# Patient Record
Sex: Male | Born: 1960
Health system: Southern US, Community
[De-identification: ages and names within clinical notes are randomized; demographics above are authoritative.]

## PROBLEM LIST (undated history)

## (undated) DIAGNOSIS — E785 Hyperlipidemia, unspecified: Secondary | ICD-10-CM

## (undated) DIAGNOSIS — S92309A Fracture of unspecified metatarsal bone(s), unspecified foot, initial encounter for closed fracture: Secondary | ICD-10-CM

## (undated) DIAGNOSIS — L719 Rosacea, unspecified: Secondary | ICD-10-CM

## (undated) HISTORY — DX: Rosacea, unspecified: L71.9

## (undated) HISTORY — DX: Fracture of unspecified metatarsal bone(s), unspecified foot, initial encounter for closed fracture: S92.309A

## (undated) HISTORY — DX: Hyperlipidemia, unspecified: E78.5

## (undated) HISTORY — PX: OTHER SURGICAL HISTORY: SHX169

---

## 1998-04-16 DIAGNOSIS — S92309A Fracture of unspecified metatarsal bone(s), unspecified foot, initial encounter for closed fracture: Secondary | ICD-10-CM

## 1998-04-16 HISTORY — DX: Fracture of unspecified metatarsal bone(s), unspecified foot, initial encounter for closed fracture: S92.309A

## 2016-06-14 DIAGNOSIS — Z0000 Encounter for general adult medical examination without abnormal findings: Secondary | ICD-10-CM | POA: Diagnosis not present

## 2016-06-14 DIAGNOSIS — E7800 Pure hypercholesterolemia, unspecified: Secondary | ICD-10-CM | POA: Diagnosis not present

## 2016-06-14 DIAGNOSIS — Z125 Encounter for screening for malignant neoplasm of prostate: Secondary | ICD-10-CM | POA: Diagnosis not present

## 2016-10-12 DIAGNOSIS — T753XXA Motion sickness, initial encounter: Secondary | ICD-10-CM | POA: Diagnosis not present

## 2017-07-02 DIAGNOSIS — E7800 Pure hypercholesterolemia, unspecified: Secondary | ICD-10-CM | POA: Diagnosis not present

## 2017-07-02 DIAGNOSIS — Z1159 Encounter for screening for other viral diseases: Secondary | ICD-10-CM | POA: Diagnosis not present

## 2017-07-02 DIAGNOSIS — Z125 Encounter for screening for malignant neoplasm of prostate: Secondary | ICD-10-CM | POA: Diagnosis not present

## 2017-07-02 DIAGNOSIS — Z1211 Encounter for screening for malignant neoplasm of colon: Secondary | ICD-10-CM | POA: Diagnosis not present

## 2017-07-02 DIAGNOSIS — Z0000 Encounter for general adult medical examination without abnormal findings: Secondary | ICD-10-CM | POA: Diagnosis not present

## 2018-07-04 DIAGNOSIS — K219 Gastro-esophageal reflux disease without esophagitis: Secondary | ICD-10-CM | POA: Diagnosis not present

## 2018-07-04 DIAGNOSIS — E7800 Pure hypercholesterolemia, unspecified: Secondary | ICD-10-CM | POA: Diagnosis not present

## 2018-07-04 DIAGNOSIS — Z1211 Encounter for screening for malignant neoplasm of colon: Secondary | ICD-10-CM | POA: Diagnosis not present

## 2018-07-04 DIAGNOSIS — Z125 Encounter for screening for malignant neoplasm of prostate: Secondary | ICD-10-CM | POA: Diagnosis not present

## 2018-07-04 DIAGNOSIS — Z0000 Encounter for general adult medical examination without abnormal findings: Secondary | ICD-10-CM | POA: Diagnosis not present

## 2018-08-08 ENCOUNTER — Telehealth: Payer: Self-pay | Admitting: *Deleted

## 2018-08-08 NOTE — Telephone Encounter (Signed)
Spoke to patient appointment moved to 4/29 with dr harding -- in office visit  Patient verbalized understanding.

## 2018-08-11 ENCOUNTER — Ambulatory Visit: Payer: Self-pay | Admitting: Cardiology

## 2018-08-12 NOTE — Progress Notes (Signed)
PCP: Ryan Haynes, Ryan Enriquez, MD  Clinic Note: Chief Complaint  Patient presents with  . New Patient (Initial Visit)    Family history of CAD.  Baseline cardiac risk    HPI: Ryan Haynes is a 58 y.o. male who is being seen today for the evaluation of baseline cardiovascular risk at the request of Ryan Haynes, Ryan Bowlby, MD.  Ryan Haynes has a history of hyperlipidemia and borderline blood pressures as well as a family history with mother having an MI in her mid 6170s early 5670s followed by CABG (she is now 6186) also has maternal grandmother had an MI at 6165.  He was recently seen by Dr. Joelyn Haynes on July 04, 2018.  He at that time noted that he was feeling quite well with no active cardiac symptoms, however his wife had recently had an MI last year, and both of his siblings (sister and brother with high blood pressure and hyperlipidemia have been told that they should be evaluated for cardiac disease.  He therefore was concerned about baseline risk.  He is an otherwise really active gentleman who is a former Ryan Haynes.  Recent Hospitalizations: None  Studies Personally Reviewed - (if available, images/films reviewed: From Epic Chart or Care Everywhere)  None  Interval History: Ryan Haynes presents here today again stating that he is still very active.  Not able to do the 4 times a week gym exercising that he used to do, so he is trying to make do with what he can do at home exercising between a stationary bicycle and weights as well as doing yard work.  He usually would work out on the elliptical and other equipment at [employer] gym.  He enjoys lifting weights as well.  He denies any symptoms of chest tightness pressure with rest or exertion.  No dyspnea on exertion.  No PND orthopnea.  No edema.  No palpitations, lightheadedness, dizziness, weakness or syncope/near syncope. No TIA/amaurosis fugax symptoms. No melena, hematochezia, hematuria, or epstaxis. No claudication.  The patient does not have symptoms concerning for  COVID-19 infection (fever, chills, cough, or new shortness of breath).  The patient is practicing social distancing.  ROS: A comprehensive was performed. Review of Systems  Constitutional: Negative for malaise/fatigue.  HENT: Negative for congestion and nosebleeds.   Respiratory: Negative for wheezing.        Some issues with allergies  Cardiovascular:       Negative per HPI  Gastrointestinal: Negative for blood in stool and melena.  Genitourinary: Negative.   Musculoskeletal: Negative.   Neurological: Negative.  Negative for dizziness and headaches.  Psychiatric/Behavioral: Negative.   All other systems reviewed and are negative.  I have reviewed and (if needed) personally updated the patient's problem list, medications, allergies, past medical and surgical history, social and family history.   Past Medical History:  Diagnosis Date  . Hyperlipidemia    Not currently on medications.  Most recent LDL 110 March 2019  . Metatarsal fracture 2000   Left foot, third-fourth and fifth  . Rosacea     Past Surgical History:  Procedure Laterality Date  . None      Current Meds  Medication Sig  . pantoprazole (PROTONIX) 40 MG tablet Take 1 tablet by mouth daily.    Not on File  Social History   Tobacco Use  . Smoking status: Former Smoker    Packs/day: 0.50    Types: Cigarettes    Start date: 1978    Last attempt to quit: 1991  Years since quitting: 29.3  . Smokeless tobacco: Never Used  Substance Use Topics  . Alcohol use: Yes    Alcohol/week: 1.0 standard drinks    Types: 1 Cans of beer per week    Comment: Either beer or glass of rum  . Drug use: Never   Social History   Social History Narrative   Married father of 2, grandfather 2.   Usually works out 4 days a week for least 45 minutes to an hour.  Does weightlifting and elliptical trainer.  (Limited somewhat by COVID-19)    family history includes Coronary artery disease (age of onset: 1) in his mother;  Heart attack (age of onset: 45) in his maternal grandmother; Heart attack (age of onset: 71) in his mother; Hypertension in his brother and sister.  Wt Readings from Last 3 Encounters:  08/13/18 196 lb 12.8 oz (89.3 kg)    PHYSICAL EXAM BP 116/82 (BP Location: Right Arm)   Pulse 73   Ht 6' (1.829 m)   Wt 196 lb 12.8 oz (89.3 kg)   BMI 26.69 kg/m  Physical Exam  Constitutional: He is oriented to person, place, and time. He appears well-developed and well-nourished. No distress.  Healthy-appearing.  Well-groomed  HENT:  Head: Normocephalic and atraumatic.  Mouth/Throat: Oropharynx is clear and moist.  Eyes: Pupils are equal, round, and reactive to light. Conjunctivae and EOM are normal.  Neck: Neck supple. No hepatojugular reflux and no JVD present. Carotid bruit is not present.  Cardiovascular: Normal rate, regular rhythm, normal heart sounds and intact distal pulses.  No extrasystoles are present. PMI is not displaced. Exam reveals no gallop and no friction rub.  No murmur heard. Pulmonary/Chest: Effort normal and breath sounds normal. No respiratory distress. He has no wheezes. He has no rales.  Abdominal: Soft. Bowel sounds are normal. He exhibits no distension. There is no abdominal tenderness. There is no rebound.  Musculoskeletal: Normal range of motion.        General: No edema.  Neurological: He is alert and oriented to person, place, and time. No cranial nerve deficit.  Psychiatric: He has a normal mood and affect. His behavior is normal. Judgment and thought content normal.  Vitals reviewed.    Adult ECG Report  Rate: 73 ;  Rhythm: normal sinus rhythm, sinus arrhythmia and Otherwise normal axis, intervals and durations;   Narrative Interpretation: Normal EKG   Other studies Reviewed: Additional studies/ records that were reviewed today include:   Recent Labs: July 04, 2018  Na+ 141, K+ 4.5, Cl- 104, HCO3-31, BUN 22, Cr 1.1, Glu 88, Ca2+ 9.8; AST 19, ALT 24, AlkP 40   CBC: W 5.9, H/H 15.2/44.8, Plt 248; TSH 3.46.  PSA 0.42.  TC 159, TG 57, HDL 41, LDL 107  ASSESSMENT / PLAN:  Ryan Haynes is a relatively healthy gentleman with well-controlled blood pressure and mild hyperlipidemia.  He is concerned based on his family history.  He himself is asymptomatic and very active.  Relatively low risk.  We discussed potential screening options for coronary artery disease.  I discussed the pathophysiology of coronary artery disease and coronary calcification.  My recommendation would be to do a baseline evaluation with a coronary calcium score.  This will tell us the extent of any potential coronary artery disease and which will help guide management of his hyperlipidemia.  Do not think a stress test would be at all helpful.  Problem List Items Addressed This Visit    Endogenous hyperlipidemia (Chronic)  Relevant Orders   EKG 12-Lead (Completed)   CT CARDIAC SCORING   Family history of coronary artery disease - Primary (Chronic)   Relevant Orders   EKG 12-Lead (Completed)   CT CARDIAC SCORING     Depending on the results of his coronary calcium score, we will may or may not need to see him back in follow-up.  He does understand that with the COVID-19 restrictions, this procedure will likely be delayed until we are doing elective noninvasive procedures..  I spent a total of 25 minutes with the patient and chart review. >  50% of the time was spent in direct patient consultation.   COVID-19 Education: The signs and symptoms of COVID-19 were discussed with the patient and how to seek care for testing (follow up with PCP or arrange E-visit).   The importance of social distancing was discussed today.  Current medicines are reviewed at length with the patient today.  (+/- concerns) none The following changes have been made:  None  Patient Instructions  Medication Instructions:  NOT NEEDED If you need a refill on your cardiac medications before your next  appointment, please call your pharmacy.   Lab work: NO LABS  If you have labs (blood work) drawn today and your tests are completely normal, you will receive your results only by: Marland Kitchen MyChart Message (if you have MyChart) OR . A paper copy in the mail If you have any lab test that is abnormal or we need to change your treatment, we will call you to review the results.  Testing/Procedures:  CT coronary calcium score. This test is done at [address]. This is $150 out of pocket.  Follow-Up: At [facility], you and your health needs are our priority.  As part of our continuing mission to provide you with exceptional heart care, we have created designated Provider Care Teams.  These Care Teams include your primary Cardiologist (physician) and Advanced Practice Providers (APPs -  Physician Assistants and Nurse Practitioners) who all work together to provide you with the care you need, when you need it. . Your physician recommends that you schedule a follow-up appointment ON AN AS NEEDED BASIS UNLESS TEST COMES BACK ABNORMAL. Marland Kitchen   Any Other Special Instructions Will Be Listed Below (If Applicable).    Studies Ordered:   Orders Placed This Encounter  Procedures  . CT CARDIAC SCORING  . EKG 12-Lead      Bryan Lemma, M.D., M.S. Interventional Cardiologist   Pager # [phone] Phone # [phone] [address] [place], [place] [address]   Thank you for choosing Heartcare at [facility]!!

## 2018-08-13 ENCOUNTER — Ambulatory Visit (INDEPENDENT_AMBULATORY_CARE_PROVIDER_SITE_OTHER): Payer: BLUE CROSS/BLUE SHIELD | Admitting: Cardiology

## 2018-08-13 ENCOUNTER — Other Ambulatory Visit: Payer: Self-pay

## 2018-08-13 ENCOUNTER — Encounter: Payer: Self-pay | Admitting: Cardiology

## 2018-08-13 VITALS — BP 116/82 | HR 73 | Ht 72.0 in | Wt 196.8 lb

## 2018-08-13 DIAGNOSIS — E781 Pure hyperglyceridemia: Secondary | ICD-10-CM | POA: Diagnosis not present

## 2018-08-13 DIAGNOSIS — Z8249 Family history of ischemic heart disease and other diseases of the circulatory system: Secondary | ICD-10-CM | POA: Diagnosis not present

## 2018-08-13 NOTE — Patient Instructions (Signed)
Medication Instructions:  NOT NEEDED If you need a refill on your cardiac medications before your next appointment, please call your pharmacy.   Lab work: NO LABS  If you have labs (blood work) drawn today and your tests are completely normal, you will receive your results only by: Marland Kitchen MyChart Message (if you have MyChart) OR . A paper copy in the mail If you have any lab test that is abnormal or we need to change your treatment, we will call you to review the results.  Testing/Procedures:  CT coronary calcium score. This test is done at [address]. This is $150 out of pocket.   Coronary CalciumScan A coronary calcium scan is an imaging test used to look for deposits of calcium and other fatty materials (plaques) in the inner lining of the blood vessels of the heart (coronary arteries). These deposits of calcium and plaques can partly clog and narrow the coronary arteries without producing any symptoms or warning signs. This puts a person at risk for a heart attack. This test can detect these deposits before symptoms develop. Tell a health care provider about:  Any allergies you have.  All medicines you are taking, including vitamins, herbs, eye drops, creams, and over-the-counter medicines.  Any problems you or family members have had with anesthetic medicines.  Any blood disorders you have.  Any surgeries you have had.  Any medical conditions you have.  Whether you are pregnant or may be pregnant. What are the risks? Generally, this is a safe procedure. However, problems may occur, including:  Harm to a pregnant woman and her unborn baby. This test involves the use of radiation. Radiation exposure can be dangerous to a pregnant woman and her unborn baby. If you are pregnant, you generally should not have this procedure done.  Slight increase in the risk of cancer. This is because of the radiation involved in the test. What happens before the procedure? No  preparation is needed for this procedure. What happens during the procedure?  You will undress and remove any jewelry around your neck or chest.  You will put on a hospital gown.  Sticky electrodes will be placed on your chest. The electrodes will be connected to an electrocardiogram (ECG) machine to record a tracing of the electrical activity of your heart.  A CT scanner will take pictures of your heart. During this time, you will be asked to lie still and hold your breath for 2-3 seconds while a picture of your heart is being taken. The procedure may vary among health care providers and hospitals. What happens after the procedure?  You can get dressed.  You can return to your normal activities.  It is up to you to get the results of your test. Ask your health care provider, or the department that is doing the test, when your results will be ready. Summary  A coronary calcium scan is an imaging test used to look for deposits of calcium and other fatty materials (plaques) in the inner lining of the blood vessels of the heart (coronary arteries).  Generally, this is a safe procedure. Tell your health care provider if you are pregnant or may be pregnant.  No preparation is needed for this procedure.  A CT scanner will take pictures of your heart.  You can return to your normal activities after the scan is done. This information is not intended to replace advice given to you by your health care provider. Make sure you  discuss any questions you have with your health care provider. Document Released: 09/29/2007 Document Revised: 02/20/2016 Document Reviewed: 02/20/2016 Elsevier Interactive Patient Education  2017 [employer]   Follow-Up: At [facility], you and your health needs are our priority.  As part of our continuing mission to provide you with exceptional heart care, we have created designated Provider Care Teams.  These Care Teams include your primary Cardiologist  (physician) and Advanced Practice Providers (APPs -  Physician Assistants and Nurse Practitioners) who all work together to provide you with the care you need, when you need it. . Your physician recommends that you schedule a follow-up appointment ON AN AS NEEDED BASIS UNLESS TEST COMES BACK ABNORMAL. Marland Kitchen.   Any Other Special Instructions Will Be Listed Below (If Applicable).

## 2018-08-14 ENCOUNTER — Encounter: Payer: Self-pay | Admitting: Cardiology

## 2018-12-03 ENCOUNTER — Ambulatory Visit (INDEPENDENT_AMBULATORY_CARE_PROVIDER_SITE_OTHER)
Admission: RE | Admit: 2018-12-03 | Discharge: 2018-12-03 | Disposition: A | Discharge status: Home / Self Care | Payer: Self-pay | Source: Ambulatory Visit | Attending: Cardiology | Admitting: Cardiology

## 2018-12-03 ENCOUNTER — Other Ambulatory Visit: Payer: Self-pay

## 2018-12-03 DIAGNOSIS — Z8249 Family history of ischemic heart disease and other diseases of the circulatory system: Secondary | ICD-10-CM

## 2018-12-03 DIAGNOSIS — E781 Pure hyperglyceridemia: Secondary | ICD-10-CM

## 2019-08-21 DIAGNOSIS — E7800 Pure hypercholesterolemia, unspecified: Secondary | ICD-10-CM | POA: Diagnosis not present

## 2019-08-21 DIAGNOSIS — Z125 Encounter for screening for malignant neoplasm of prostate: Secondary | ICD-10-CM | POA: Diagnosis not present

## 2019-08-21 DIAGNOSIS — Z0000 Encounter for general adult medical examination without abnormal findings: Secondary | ICD-10-CM | POA: Diagnosis not present

## 2019-08-21 DIAGNOSIS — K219 Gastro-esophageal reflux disease without esophagitis: Secondary | ICD-10-CM | POA: Diagnosis not present

## 2019-08-21 DIAGNOSIS — Z8249 Family history of ischemic heart disease and other diseases of the circulatory system: Secondary | ICD-10-CM | POA: Diagnosis not present

## 2019-08-21 DIAGNOSIS — Z23 Encounter for immunization: Secondary | ICD-10-CM | POA: Diagnosis not present

## 2020-01-21 DIAGNOSIS — Z23 Encounter for immunization: Secondary | ICD-10-CM | POA: Diagnosis not present

## 2020-04-11 DIAGNOSIS — U071 COVID-19: Secondary | ICD-10-CM | POA: Diagnosis not present

## 2020-09-08 DIAGNOSIS — Z0000 Encounter for general adult medical examination without abnormal findings: Secondary | ICD-10-CM | POA: Diagnosis not present

## 2020-09-08 DIAGNOSIS — E7800 Pure hypercholesterolemia, unspecified: Secondary | ICD-10-CM | POA: Diagnosis not present

## 2020-09-08 DIAGNOSIS — K219 Gastro-esophageal reflux disease without esophagitis: Secondary | ICD-10-CM | POA: Diagnosis not present

## 2020-09-08 DIAGNOSIS — Z8249 Family history of ischemic heart disease and other diseases of the circulatory system: Secondary | ICD-10-CM | POA: Diagnosis not present

## 2020-09-08 DIAGNOSIS — Z8616 Personal history of COVID-19: Secondary | ICD-10-CM | POA: Diagnosis not present

## 2020-09-08 DIAGNOSIS — Z125 Encounter for screening for malignant neoplasm of prostate: Secondary | ICD-10-CM | POA: Diagnosis not present

## 2020-09-09 ENCOUNTER — Other Ambulatory Visit: Payer: Self-pay | Admitting: Family Medicine

## 2020-09-09 DIAGNOSIS — E7800 Pure hypercholesterolemia, unspecified: Secondary | ICD-10-CM

## 2020-09-27 ENCOUNTER — Ambulatory Visit
Admission: RE | Admit: 2020-09-27 | Discharge: 2020-09-27 | Disposition: A | Discharge status: Home / Self Care | Payer: No Typology Code available for payment source | Source: Ambulatory Visit | Attending: Family Medicine | Admitting: Family Medicine

## 2020-09-27 DIAGNOSIS — E7800 Pure hypercholesterolemia, unspecified: Secondary | ICD-10-CM

## 2020-12-01 DIAGNOSIS — E7800 Pure hypercholesterolemia, unspecified: Secondary | ICD-10-CM | POA: Diagnosis not present

## 2021-03-04 ENCOUNTER — Other Ambulatory Visit: Payer: Self-pay

## 2021-03-04 ENCOUNTER — Emergency Department (HOSPITAL_BASED_OUTPATIENT_CLINIC_OR_DEPARTMENT_OTHER)
Admission: EM | Admit: 2021-03-04 | Discharge: 2021-03-04 | Disposition: A | Discharge status: Home / Self Care | Payer: BC Managed Care – PPO | Attending: Emergency Medicine | Admitting: Emergency Medicine

## 2021-03-04 ENCOUNTER — Encounter (HOSPITAL_BASED_OUTPATIENT_CLINIC_OR_DEPARTMENT_OTHER): Payer: Self-pay

## 2021-03-04 DIAGNOSIS — Y9239 Other specified sports and athletic area as the place of occurrence of the external cause: Secondary | ICD-10-CM | POA: Insufficient documentation

## 2021-03-04 DIAGNOSIS — S61210A Laceration without foreign body of right index finger without damage to nail, initial encounter: Secondary | ICD-10-CM | POA: Diagnosis not present

## 2021-03-04 DIAGNOSIS — Z87891 Personal history of nicotine dependence: Secondary | ICD-10-CM | POA: Diagnosis not present

## 2021-03-04 DIAGNOSIS — W208XXA Other cause of strike by thrown, projected or falling object, initial encounter: Secondary | ICD-10-CM | POA: Insufficient documentation

## 2021-03-04 DIAGNOSIS — S6991XA Unspecified injury of right wrist, hand and finger(s), initial encounter: Secondary | ICD-10-CM | POA: Diagnosis not present

## 2021-03-04 MED ORDER — LIDOCAINE HCL (PF) 1 % IJ SOLN
10.0000 mL | Freq: Once | INTRAMUSCULAR | Status: AC
  Administered 2021-03-04: 10 mL
  Filled 2021-03-04: qty 10

## 2021-03-04 MED ORDER — BACITRACIN ZINC 500 UNIT/GM EX OINT
TOPICAL_OINTMENT | Freq: Once | CUTANEOUS | Status: AC
  Filled 2021-03-04: qty 28.35

## 2021-03-04 MED ORDER — BACITRACIN ZINC 500 UNIT/GM EX OINT: 1.0000 "application " | TOPICAL_OINTMENT | Freq: Two times a day (BID) | CUTANEOUS | 0 refills | Status: AC

## 2021-03-04 NOTE — ED Notes (Signed)
Pt d/c home per MD order. Discharge summary reviewed with pt, pt verbalizes understanding. Ambulatory off unit. No s/s of acute distress noted at discharge.  °

## 2021-03-04 NOTE — ED Provider Notes (Signed)
[facility] EMERGENCY DEPT Provider Note   CSN: [id number] Arrival date & time: 03/04/21  9678     History Chief Complaint  Patient presents with   finger laceration    Ryan Haynes is a 60 y.o. male.  HPI 60 year old male presents with right finger injury.  Last night he was at the gym at around 6 PM when a dumbbell fell and crushed his finger.  He states that he cleaned the wound with soap and water.  However he could not get it to stop bleeding and this morning he felt like it needed to get some stitches.  His last tetanus immunization was a year or 2 ago.  He states that it is painful at the area of the cut but he does not think he broke it.  Took some Aleve.  Past Medical History:  Diagnosis Date   Hyperlipidemia    Not currently on medications.  Most recent LDL 110 March 2019   Metatarsal fracture 2000   Left foot, third-fourth and fifth   Rosacea     Patient Active Problem List   Diagnosis Date Noted   Family history of coronary artery disease 08/13/2018   Endogenous hyperlipidemia 08/13/2018    Past Surgical History:  Procedure Laterality Date   None         Family History  Problem Relation Age of Onset   Coronary artery disease Mother 55       Status post CABG   Heart attack Mother 77   Heart attack Maternal Grandmother 7   Hypertension Sister        And borderline hyperlipidemia   Hypertension Brother        And borderline hyperlipidemia    Social History   Tobacco Use   Smoking status: Former    Packs/day: 0.50    Types: Cigarettes    Start date: 1978    Quit date: 1991    Years since quitting: 31.9   Smokeless tobacco: Never  Substance Use Topics   Alcohol use: Yes    Alcohol/week: 1.0 standard drink    Types: 1 Cans of beer per week    Comment: Either beer or glass of rum   Drug use: Never    Home Medications Prior to Admission medications   Medication Sig Start Date End Date Taking? Authorizing Provider   bacitracin ointment Apply 1 application topically 2 (two) times daily. 03/04/21  Yes Pricilla Loveless, MD  pantoprazole (PROTONIX) 40 MG tablet Take 1 tablet by mouth daily. 07/27/16   [provider]    Allergies    Patient has no known allergies.  Review of Systems   Review of Systems  Musculoskeletal:  Negative for arthralgias.  Skin:  Positive for wound.   Physical Exam Updated Vital Signs BP (!) 133/95 (BP Location: Right Arm)   Pulse 66   Temp 98 F (36.7 C) (Oral)   Resp 16   SpO2 100%   Physical Exam Vitals and nursing note reviewed.  Constitutional:      Appearance: He is well-developed.  HENT:     Head: Normocephalic and atraumatic.     Right Ear: External ear normal.     Left Ear: External ear normal.     Nose: Nose normal.  Eyes:     General:        Right eye: No discharge.        Left eye: No discharge.  Cardiovascular:     Rate and Rhythm: Normal rate  and regular rhythm.     Pulses:          Radial pulses are 2+ on the right side.  Pulmonary:     Effort: Pulmonary effort is normal.  Abdominal:     General: There is no distension.  Musculoskeletal:     Right hand: Laceration present. No bony tenderness. Normal range of motion.     Comments: To the distal volar tip of the right index finger there is a linear laceration that goes up to but does not involve the nailbed.  He has full range of motion of his finger.  There is a little bit of subungual hematoma but it is at 50% or less to the proximal nail bed  Skin:    General: Skin is warm and dry.  Neurological:     Mental Status: He is alert.  Psychiatric:        Mood and Affect: Mood is not anxious.    ED Results / Procedures / Treatments   Labs (all labs ordered are listed, but only abnormal results are displayed) Labs Reviewed - No data to display  EKG None  Radiology No results found.  Procedures .Marland KitchenLaceration Repair  Date/Time: 03/04/2021 12:07 PM Performed by: Pricilla Loveless, MD Authorized by: Pricilla Loveless, MD   Consent:    Consent obtained:  Verbal   Consent given by:  Patient   Risks, benefits, and alternatives were discussed: yes   Universal protocol:    Patient identity confirmed:  Verbally with patient Anesthesia:    Anesthesia method:  Nerve block   Block location:  Digital   Block needle gauge:  25 G   Block anesthetic:  Lidocaine 1% w/o epi   Block technique:  Digital   Block injection procedure:  Anatomic landmarks identified, anatomic landmarks palpated, negative aspiration for blood, introduced needle and incremental injection   Block outcome:  Anesthesia achieved Laceration details:    Location:  Finger   Finger location:  R index finger   Length (cm):  3 Pre-procedure details:    Preparation:  Patient was prepped and draped in usual sterile fashion Exploration:    Limited defect created (wound extended): no   Treatment:    Area cleansed with:  Saline   Amount of cleaning:  Standard   Irrigation solution:  Sterile water   Irrigation method:  Syringe Skin repair:    Repair method:  Sutures   Suture size:  4-0   Suture material:  Nylon   Suture technique:  Simple interrupted   Number of sutures:  4 Approximation:    Approximation:  Close Repair type:    Repair type:  Simple Post-procedure details:    Dressing:  Antibiotic ointment   Procedure completion:  Tolerated well, no immediate complications   Medications Ordered in ED Medications  bacitracin ointment (has no administration in time range)  lidocaine (PF) (XYLOCAINE) 1 % injection 10 mL (10 mLs Infiltration Given by Other 03/04/21 1125)    ED Course  I have reviewed the triage vital signs and the nursing notes.  Pertinent labs & imaging results that were available during my care of the patient were reviewed by me and considered in my medical decision making (see chart for details).    MDM Rules/Calculators/A&P                           Fortunately the  laceration does not seem to go into the nailbed.  There probably is a small nailbed injury given a small subungual hematoma, but I do not think his benefit require nail removal or trephination at this point.  We did discuss that with his relative delay in seeking medical care, there is a little bit of an increased chance of infection but we decide together to proceed with repair given the gaping nature.  Otherwise, wound repair was fairly straightforward and we will apply bacitracin ointment and have him follow-up with PCP for suture removal.  He declined x-ray after what sounds like a mild crush injury. Final Clinical Impression(s) / ED Diagnoses Final diagnoses:  Laceration of right index finger without foreign body without damage to nail, initial encounter    Rx / DC Orders ED Discharge Orders          Ordered    bacitracin ointment  2 times daily        03/04/21 1207             Pricilla Loveless, MD 03/04/21 1210

## 2021-03-04 NOTE — ED Triage Notes (Signed)
He tells me he dropped a barbel onto his right index finger at the gym yesterday, thereby lac. Distal right index finger.

## 2021-05-24 DIAGNOSIS — L03031 Cellulitis of right toe: Secondary | ICD-10-CM | POA: Diagnosis not present

## 2021-07-21 DIAGNOSIS — D485 Neoplasm of uncertain behavior of skin: Secondary | ICD-10-CM | POA: Diagnosis not present

## 2021-07-21 DIAGNOSIS — L980 Pyogenic granuloma: Secondary | ICD-10-CM | POA: Diagnosis not present

## 2021-07-21 DIAGNOSIS — L570 Actinic keratosis: Secondary | ICD-10-CM | POA: Diagnosis not present

## 2021-07-21 DIAGNOSIS — D1801 Hemangioma of skin and subcutaneous tissue: Secondary | ICD-10-CM | POA: Diagnosis not present

## 2021-07-21 DIAGNOSIS — B079 Viral wart, unspecified: Secondary | ICD-10-CM | POA: Diagnosis not present

## 2021-07-21 DIAGNOSIS — B078 Other viral warts: Secondary | ICD-10-CM | POA: Diagnosis not present

## 2021-09-05 DIAGNOSIS — L578 Other skin changes due to chronic exposure to nonionizing radiation: Secondary | ICD-10-CM | POA: Diagnosis not present

## 2021-09-05 DIAGNOSIS — D1801 Hemangioma of skin and subcutaneous tissue: Secondary | ICD-10-CM | POA: Diagnosis not present

## 2021-09-05 DIAGNOSIS — L821 Other seborrheic keratosis: Secondary | ICD-10-CM | POA: Diagnosis not present

## 2021-09-05 DIAGNOSIS — D485 Neoplasm of uncertain behavior of skin: Secondary | ICD-10-CM | POA: Diagnosis not present

## 2021-09-05 DIAGNOSIS — L814 Other melanin hyperpigmentation: Secondary | ICD-10-CM | POA: Diagnosis not present

## 2021-11-13 DIAGNOSIS — Z125 Encounter for screening for malignant neoplasm of prostate: Secondary | ICD-10-CM | POA: Diagnosis not present

## 2021-11-13 DIAGNOSIS — K219 Gastro-esophageal reflux disease without esophagitis: Secondary | ICD-10-CM | POA: Diagnosis not present

## 2021-11-13 DIAGNOSIS — Z0000 Encounter for general adult medical examination without abnormal findings: Secondary | ICD-10-CM | POA: Diagnosis not present

## 2021-11-13 DIAGNOSIS — M25512 Pain in left shoulder: Secondary | ICD-10-CM | POA: Diagnosis not present

## 2021-11-13 DIAGNOSIS — E7800 Pure hypercholesterolemia, unspecified: Secondary | ICD-10-CM | POA: Diagnosis not present

## 2021-11-13 DIAGNOSIS — R030 Elevated blood-pressure reading, without diagnosis of hypertension: Secondary | ICD-10-CM | POA: Diagnosis not present

## 2021-11-20 DIAGNOSIS — M19012 Primary osteoarthritis, left shoulder: Secondary | ICD-10-CM | POA: Diagnosis not present

## 2021-11-23 DIAGNOSIS — M25612 Stiffness of left shoulder, not elsewhere classified: Secondary | ICD-10-CM | POA: Diagnosis not present

## 2021-11-23 DIAGNOSIS — M19012 Primary osteoarthritis, left shoulder: Secondary | ICD-10-CM | POA: Diagnosis not present

## 2021-11-23 DIAGNOSIS — M6281 Muscle weakness (generalized): Secondary | ICD-10-CM | POA: Diagnosis not present

## 2021-11-29 DIAGNOSIS — M6281 Muscle weakness (generalized): Secondary | ICD-10-CM | POA: Diagnosis not present

## 2021-11-29 DIAGNOSIS — M19012 Primary osteoarthritis, left shoulder: Secondary | ICD-10-CM | POA: Diagnosis not present

## 2021-11-29 DIAGNOSIS — M25612 Stiffness of left shoulder, not elsewhere classified: Secondary | ICD-10-CM | POA: Diagnosis not present

## 2021-12-06 DIAGNOSIS — M6281 Muscle weakness (generalized): Secondary | ICD-10-CM | POA: Diagnosis not present

## 2021-12-06 DIAGNOSIS — M19012 Primary osteoarthritis, left shoulder: Secondary | ICD-10-CM | POA: Diagnosis not present

## 2021-12-06 DIAGNOSIS — M25612 Stiffness of left shoulder, not elsewhere classified: Secondary | ICD-10-CM | POA: Diagnosis not present

## 2021-12-13 DIAGNOSIS — M25612 Stiffness of left shoulder, not elsewhere classified: Secondary | ICD-10-CM | POA: Diagnosis not present

## 2021-12-13 DIAGNOSIS — M6281 Muscle weakness (generalized): Secondary | ICD-10-CM | POA: Diagnosis not present

## 2021-12-13 DIAGNOSIS — M19012 Primary osteoarthritis, left shoulder: Secondary | ICD-10-CM | POA: Diagnosis not present

## 2021-12-20 DIAGNOSIS — M25612 Stiffness of left shoulder, not elsewhere classified: Secondary | ICD-10-CM | POA: Diagnosis not present

## 2021-12-20 DIAGNOSIS — M6281 Muscle weakness (generalized): Secondary | ICD-10-CM | POA: Diagnosis not present

## 2021-12-20 DIAGNOSIS — M19012 Primary osteoarthritis, left shoulder: Secondary | ICD-10-CM | POA: Diagnosis not present

## 2022-01-01 ENCOUNTER — Other Ambulatory Visit: Payer: Self-pay | Admitting: Orthopedic Surgery

## 2022-01-01 DIAGNOSIS — M19012 Primary osteoarthritis, left shoulder: Secondary | ICD-10-CM | POA: Diagnosis not present

## 2022-01-01 DIAGNOSIS — M25512 Pain in left shoulder: Secondary | ICD-10-CM

## 2022-01-12 ENCOUNTER — Ambulatory Visit
Admission: RE | Admit: 2022-01-12 | Discharge: 2022-01-12 | Disposition: A | Discharge status: Home / Self Care | Payer: BC Managed Care – PPO | Source: Ambulatory Visit | Attending: Orthopedic Surgery | Admitting: Orthopedic Surgery

## 2022-01-12 DIAGNOSIS — M25512 Pain in left shoulder: Secondary | ICD-10-CM

## 2022-01-31 DIAGNOSIS — M19012 Primary osteoarthritis, left shoulder: Secondary | ICD-10-CM | POA: Diagnosis not present

## 2022-02-05 DIAGNOSIS — M25612 Stiffness of left shoulder, not elsewhere classified: Secondary | ICD-10-CM | POA: Diagnosis not present

## 2022-02-05 DIAGNOSIS — M19012 Primary osteoarthritis, left shoulder: Secondary | ICD-10-CM | POA: Diagnosis not present

## 2022-02-12 IMAGING — CT CT CARDIAC CORONARY ARTERY CALCIUM SCORE
3 series · 14 of 20 positions shown, 16 images · non-contrast
Comparison: Coronary calcium score 12/03/2018

CLINICAL DATA: 59-year-old white male with elevated cholesterol.

EXAM:
CT CARDIAC CORONARY ARTERY CALCIUM SCORE
TECHNIQUE: Non-contrast imaging through the heart was performed using
prospective ECG gating. Image post processing was performed on an
independent workstation, allowing for quantitative analysis of the
heart and coronary arteries. Note that this exam targets the heart
and the chest was not imaged in its entirety.

[Series 2: calcium scoring 2.00 qr36 bestdiast 70% hrt calciu · axial · 0.37mm/px · z∈[+1734,+1808]mm · 4 of 63 slices shown]
[im 13/63  vessel]
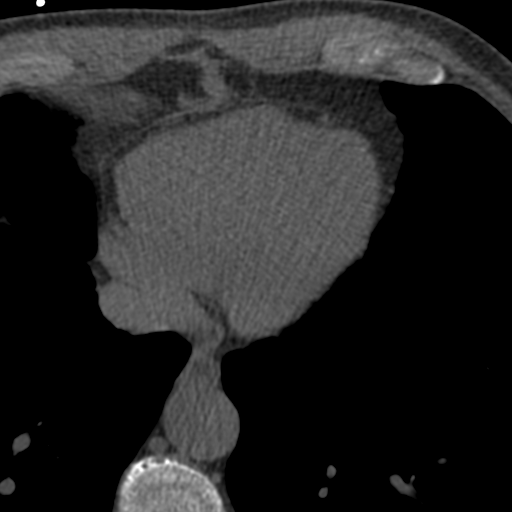
[im 25/63  vessel]
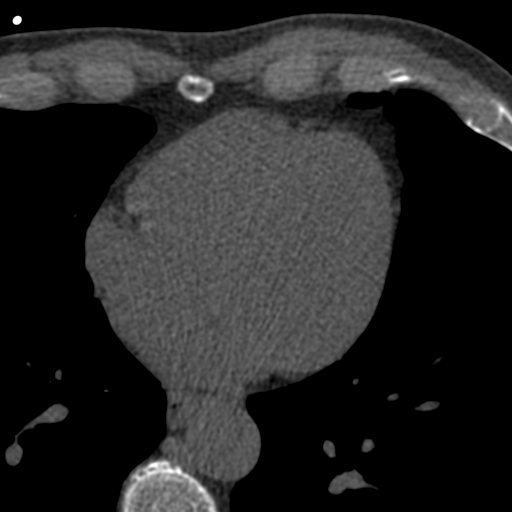
[im 38/63  vessel]
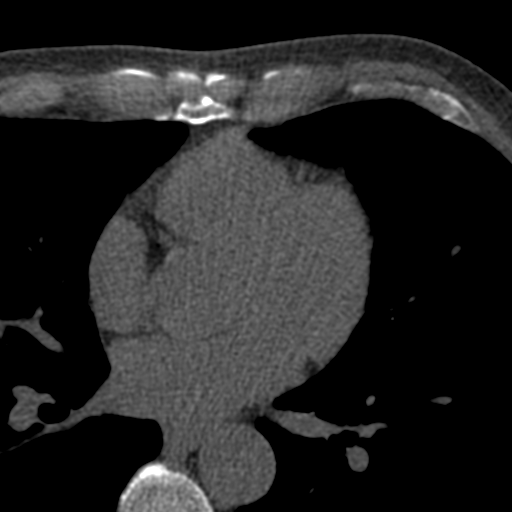
[im 50/63  vessel]
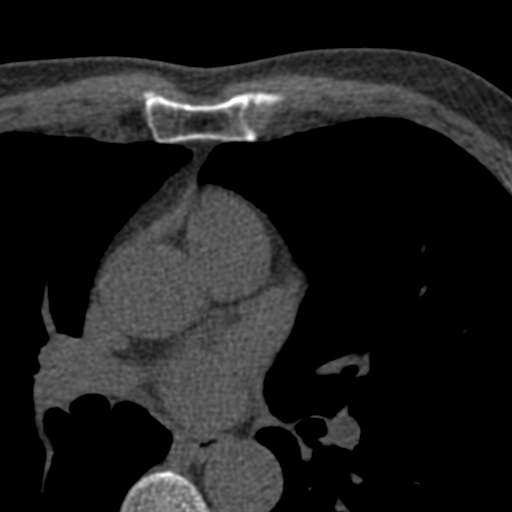

[Series 3: calcium scoring 2.00 br40 bestdiast 70% axial · axial · 0.66mm/px · z∈[+1730,+1812]mm · 5 of 63 slices shown, 7 images]
[im 11/63  vessel]
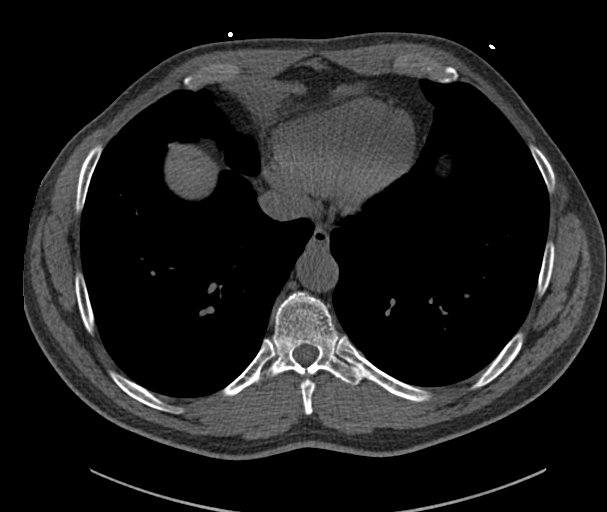
[im 11/63  lung]
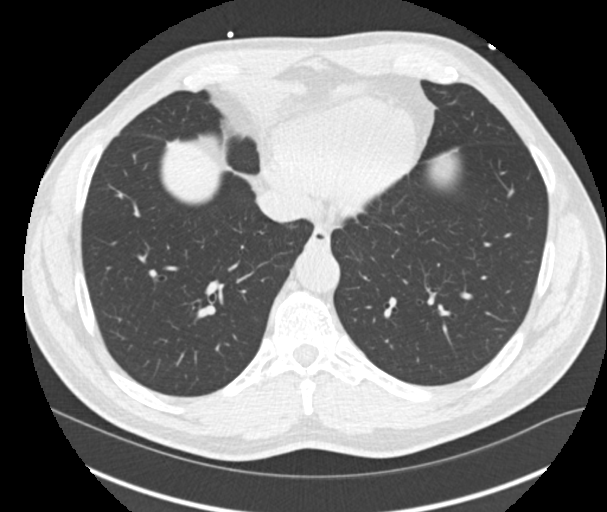
[im 21/63  vessel]
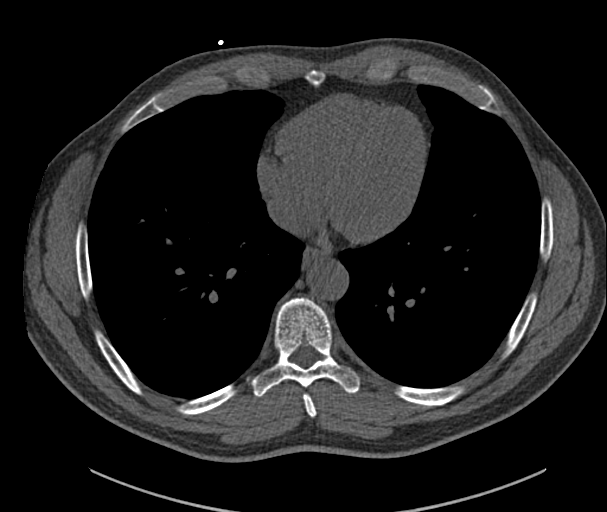
[im 32/63  vessel]
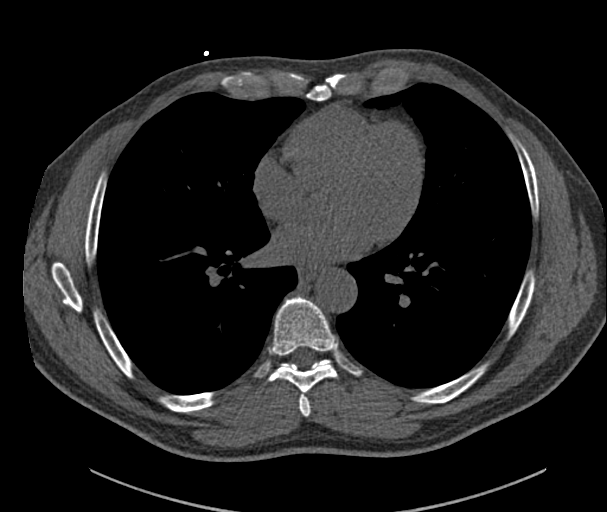
[im 42/63  vessel]
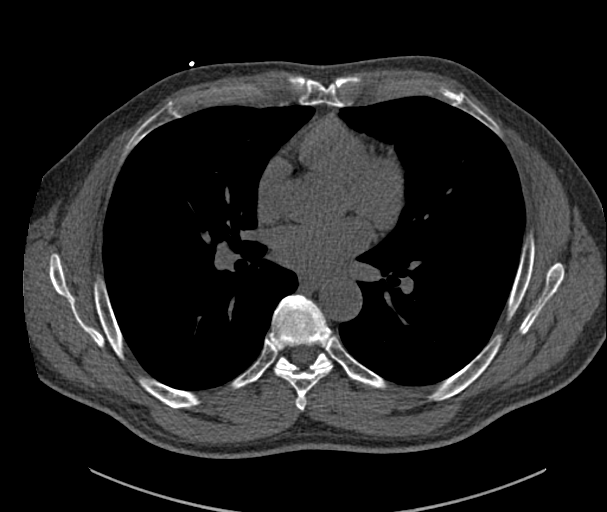
[im 52/63  vessel]
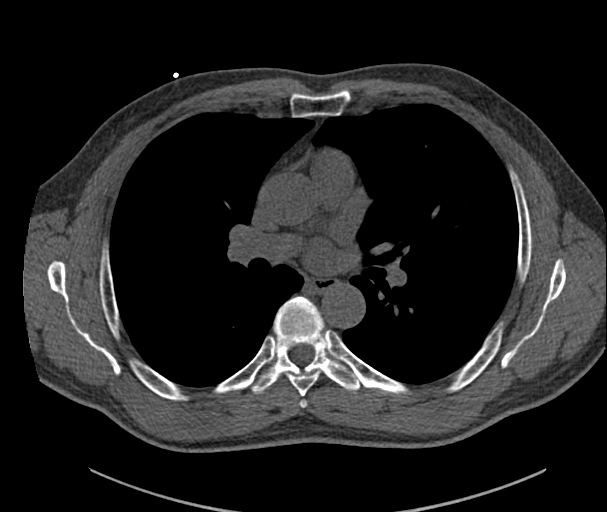
[im 52/63  lung]
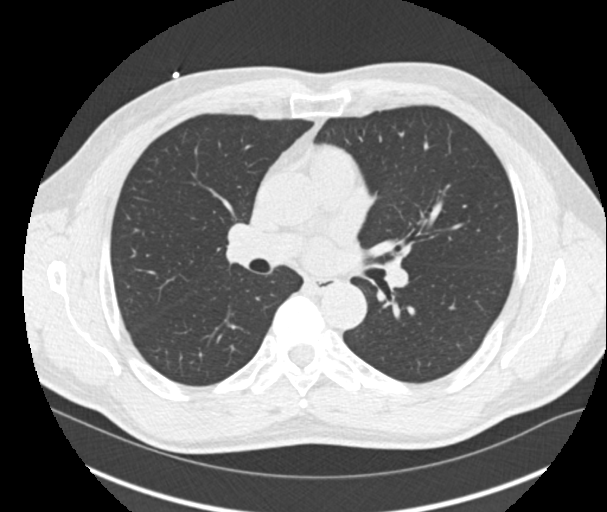

[Series 9: calcium scoring 2.00 br60 bestdiast 70% lungs · axial · 0.66mm/px · z∈[+1730,+1812]mm · 5 of 63 slices shown]
[im 11/63  vessel]
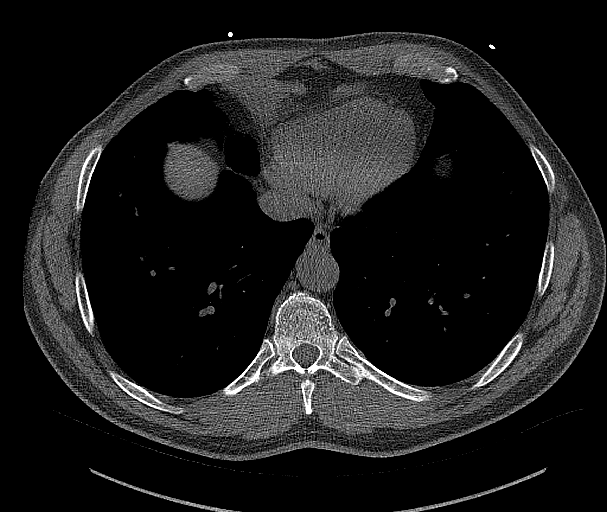
[im 21/63  vessel]
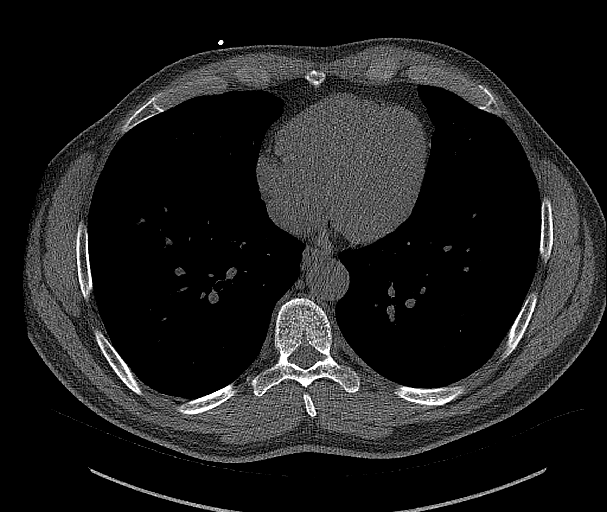
[im 32/63  vessel]
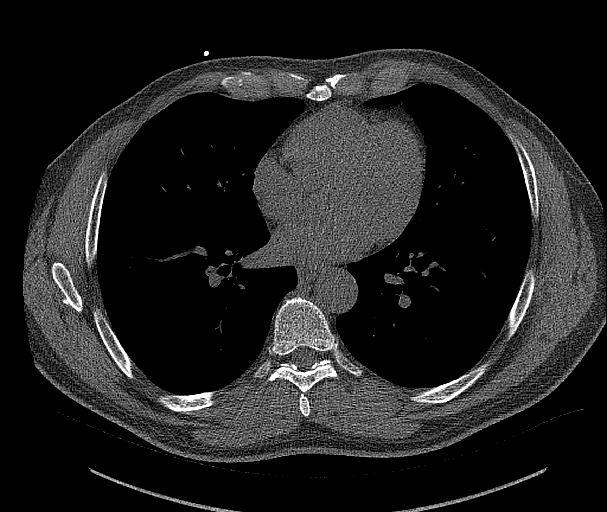
[im 42/63  vessel]
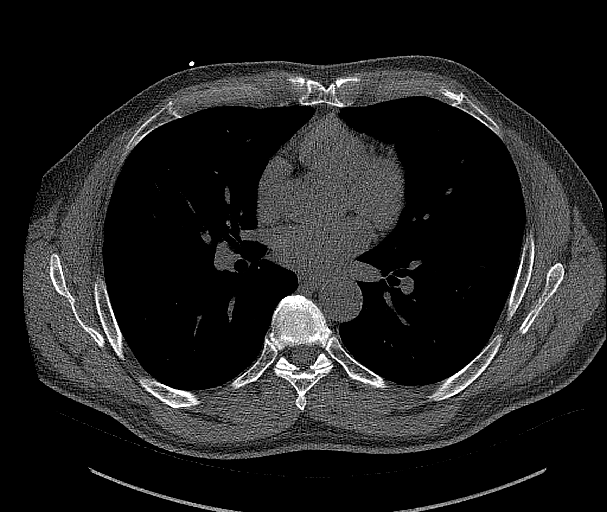
[im 52/63  vessel]
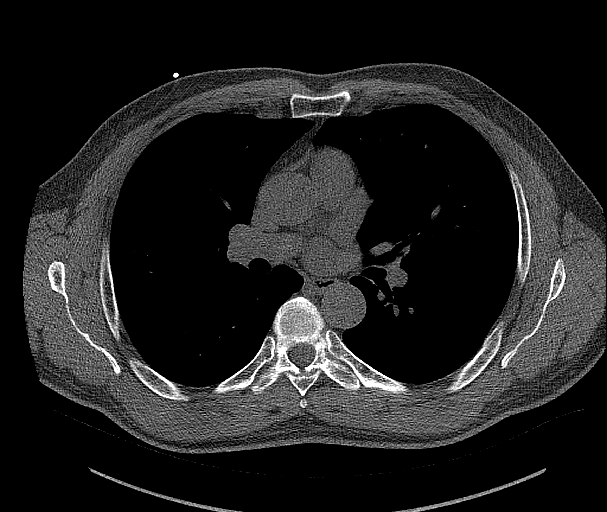

[14 of 20 positions shown; findings below may reference images not displayed]

FINDINGS: CORONARY CALCIUM SCORES:

Left Main: 0

LAD: 212

LCx:

RCA: 0

Total Agatston Score: 213

[HOSPITAL] percentile: 77

AORTA MEASUREMENTS:

Ascending Aorta: 36 mm

Descending Aorta: 29 mm

OTHER FINDINGS:

Heart size is normal. No significant pericardial fluid. Visualized
mediastinal structures are unremarkable. Images of the upper abdomen
are unremarkable. Visualized lungs are clear without pleural
effusions. No acute bone abnormality.
IMPRESSION: Coronary calcium score is 213 and this is at percentile 77 for
patients of the same age, gender and ethnicity. Coronary calcium
score has increased since 4545 when the calculated score was 112.

## 2022-02-20 DIAGNOSIS — G8918 Other acute postprocedural pain: Secondary | ICD-10-CM | POA: Diagnosis not present

## 2022-02-20 DIAGNOSIS — M19012 Primary osteoarthritis, left shoulder: Secondary | ICD-10-CM | POA: Diagnosis not present

## 2022-04-04 DIAGNOSIS — M19012 Primary osteoarthritis, left shoulder: Secondary | ICD-10-CM | POA: Diagnosis not present

## 2022-04-12 DIAGNOSIS — M25612 Stiffness of left shoulder, not elsewhere classified: Secondary | ICD-10-CM | POA: Diagnosis not present

## 2022-04-12 DIAGNOSIS — R531 Weakness: Secondary | ICD-10-CM | POA: Diagnosis not present

## 2022-04-12 DIAGNOSIS — Z96612 Presence of left artificial shoulder joint: Secondary | ICD-10-CM | POA: Diagnosis not present

## 2022-04-19 DIAGNOSIS — Z96612 Presence of left artificial shoulder joint: Secondary | ICD-10-CM | POA: Diagnosis not present

## 2022-04-19 DIAGNOSIS — M25612 Stiffness of left shoulder, not elsewhere classified: Secondary | ICD-10-CM | POA: Diagnosis not present

## 2022-04-19 DIAGNOSIS — R531 Weakness: Secondary | ICD-10-CM | POA: Diagnosis not present

## 2022-04-23 DIAGNOSIS — M25612 Stiffness of left shoulder, not elsewhere classified: Secondary | ICD-10-CM | POA: Diagnosis not present

## 2022-04-23 DIAGNOSIS — Z96612 Presence of left artificial shoulder joint: Secondary | ICD-10-CM | POA: Diagnosis not present

## 2022-04-23 DIAGNOSIS — R531 Weakness: Secondary | ICD-10-CM | POA: Diagnosis not present

## 2022-04-25 DIAGNOSIS — R531 Weakness: Secondary | ICD-10-CM | POA: Diagnosis not present

## 2022-04-25 DIAGNOSIS — Z96612 Presence of left artificial shoulder joint: Secondary | ICD-10-CM | POA: Diagnosis not present

## 2022-04-25 DIAGNOSIS — M25612 Stiffness of left shoulder, not elsewhere classified: Secondary | ICD-10-CM | POA: Diagnosis not present

## 2022-04-30 DIAGNOSIS — M25612 Stiffness of left shoulder, not elsewhere classified: Secondary | ICD-10-CM | POA: Diagnosis not present

## 2022-04-30 DIAGNOSIS — Z96612 Presence of left artificial shoulder joint: Secondary | ICD-10-CM | POA: Diagnosis not present

## 2022-04-30 DIAGNOSIS — R531 Weakness: Secondary | ICD-10-CM | POA: Diagnosis not present

## 2022-05-02 DIAGNOSIS — Z96612 Presence of left artificial shoulder joint: Secondary | ICD-10-CM | POA: Diagnosis not present

## 2022-05-02 DIAGNOSIS — M25612 Stiffness of left shoulder, not elsewhere classified: Secondary | ICD-10-CM | POA: Diagnosis not present

## 2022-05-02 DIAGNOSIS — R531 Weakness: Secondary | ICD-10-CM | POA: Diagnosis not present

## 2022-05-07 DIAGNOSIS — Z96612 Presence of left artificial shoulder joint: Secondary | ICD-10-CM | POA: Diagnosis not present

## 2022-05-07 DIAGNOSIS — M25612 Stiffness of left shoulder, not elsewhere classified: Secondary | ICD-10-CM | POA: Diagnosis not present

## 2022-05-07 DIAGNOSIS — R531 Weakness: Secondary | ICD-10-CM | POA: Diagnosis not present

## 2022-05-09 DIAGNOSIS — R531 Weakness: Secondary | ICD-10-CM | POA: Diagnosis not present

## 2022-05-09 DIAGNOSIS — M25612 Stiffness of left shoulder, not elsewhere classified: Secondary | ICD-10-CM | POA: Diagnosis not present

## 2022-05-09 DIAGNOSIS — Z96612 Presence of left artificial shoulder joint: Secondary | ICD-10-CM | POA: Diagnosis not present

## 2022-05-14 DIAGNOSIS — R531 Weakness: Secondary | ICD-10-CM | POA: Diagnosis not present

## 2022-05-14 DIAGNOSIS — M25612 Stiffness of left shoulder, not elsewhere classified: Secondary | ICD-10-CM | POA: Diagnosis not present

## 2022-05-14 DIAGNOSIS — Z96612 Presence of left artificial shoulder joint: Secondary | ICD-10-CM | POA: Diagnosis not present

## 2022-05-16 DIAGNOSIS — R531 Weakness: Secondary | ICD-10-CM | POA: Diagnosis not present

## 2022-05-16 DIAGNOSIS — Z96612 Presence of left artificial shoulder joint: Secondary | ICD-10-CM | POA: Diagnosis not present

## 2022-05-16 DIAGNOSIS — M25612 Stiffness of left shoulder, not elsewhere classified: Secondary | ICD-10-CM | POA: Diagnosis not present

## 2022-05-21 DIAGNOSIS — Z96612 Presence of left artificial shoulder joint: Secondary | ICD-10-CM | POA: Diagnosis not present

## 2022-05-21 DIAGNOSIS — R531 Weakness: Secondary | ICD-10-CM | POA: Diagnosis not present

## 2022-05-21 DIAGNOSIS — M25612 Stiffness of left shoulder, not elsewhere classified: Secondary | ICD-10-CM | POA: Diagnosis not present

## 2022-05-23 DIAGNOSIS — R531 Weakness: Secondary | ICD-10-CM | POA: Diagnosis not present

## 2022-05-23 DIAGNOSIS — Z96612 Presence of left artificial shoulder joint: Secondary | ICD-10-CM | POA: Diagnosis not present

## 2022-05-23 DIAGNOSIS — M25612 Stiffness of left shoulder, not elsewhere classified: Secondary | ICD-10-CM | POA: Diagnosis not present

## 2022-09-07 DIAGNOSIS — Z1211 Encounter for screening for malignant neoplasm of colon: Secondary | ICD-10-CM | POA: Diagnosis not present

## 2022-09-07 DIAGNOSIS — D122 Benign neoplasm of ascending colon: Secondary | ICD-10-CM | POA: Diagnosis not present

## 2022-10-16 DIAGNOSIS — L814 Other melanin hyperpigmentation: Secondary | ICD-10-CM | POA: Diagnosis not present

## 2022-10-16 DIAGNOSIS — L578 Other skin changes due to chronic exposure to nonionizing radiation: Secondary | ICD-10-CM | POA: Diagnosis not present

## 2022-10-16 DIAGNOSIS — L821 Other seborrheic keratosis: Secondary | ICD-10-CM | POA: Diagnosis not present

## 2022-10-16 DIAGNOSIS — D1801 Hemangioma of skin and subcutaneous tissue: Secondary | ICD-10-CM | POA: Diagnosis not present

## 2023-05-16 DIAGNOSIS — Z0000 Encounter for general adult medical examination without abnormal findings: Secondary | ICD-10-CM | POA: Diagnosis not present

## 2023-05-16 DIAGNOSIS — Z125 Encounter for screening for malignant neoplasm of prostate: Secondary | ICD-10-CM | POA: Diagnosis not present

## 2023-05-16 DIAGNOSIS — K219 Gastro-esophageal reflux disease without esophagitis: Secondary | ICD-10-CM | POA: Diagnosis not present

## 2023-05-16 DIAGNOSIS — Z23 Encounter for immunization: Secondary | ICD-10-CM | POA: Diagnosis not present

## 2023-05-16 DIAGNOSIS — Z1211 Encounter for screening for malignant neoplasm of colon: Secondary | ICD-10-CM | POA: Diagnosis not present

## 2023-05-16 DIAGNOSIS — E7800 Pure hypercholesterolemia, unspecified: Secondary | ICD-10-CM | POA: Diagnosis not present
